# Patient Record
Sex: Male | Born: 1995 | Race: White | Hispanic: No | Marital: Single
Health system: Southern US, Community
[De-identification: ages and names within clinical notes are randomized; demographics above are authoritative.]

---

## 2015-05-23 ENCOUNTER — Emergency Department (HOSPITAL_COMMUNITY): Payer: BLUE CROSS/BLUE SHIELD

## 2015-05-23 ENCOUNTER — Emergency Department (HOSPITAL_COMMUNITY)
Admission: EM | Admit: 2015-05-23 | Discharge: 2015-05-23 | Disposition: A | Discharge status: Home / Self Care | Payer: BLUE CROSS/BLUE SHIELD | Attending: Emergency Medicine | Admitting: Emergency Medicine

## 2015-05-23 ENCOUNTER — Encounter (HOSPITAL_COMMUNITY): Payer: Self-pay | Admitting: *Deleted

## 2015-05-23 DIAGNOSIS — Y9389 Activity, other specified: Secondary | ICD-10-CM | POA: Diagnosis not present

## 2015-05-23 DIAGNOSIS — S0291XA Unspecified fracture of skull, initial encounter for closed fracture: Secondary | ICD-10-CM | POA: Diagnosis not present

## 2015-05-23 DIAGNOSIS — Z23 Encounter for immunization: Secondary | ICD-10-CM | POA: Insufficient documentation

## 2015-05-23 DIAGNOSIS — S80212A Abrasion, left knee, initial encounter: Secondary | ICD-10-CM | POA: Diagnosis not present

## 2015-05-23 DIAGNOSIS — S0101XA Laceration without foreign body of scalp, initial encounter: Secondary | ICD-10-CM | POA: Insufficient documentation

## 2015-05-23 DIAGNOSIS — S0181XA Laceration without foreign body of other part of head, initial encounter: Secondary | ICD-10-CM | POA: Diagnosis not present

## 2015-05-23 DIAGNOSIS — F10120 Alcohol abuse with intoxication, uncomplicated: Secondary | ICD-10-CM | POA: Diagnosis not present

## 2015-05-23 DIAGNOSIS — S80211A Abrasion, right knee, initial encounter: Secondary | ICD-10-CM | POA: Diagnosis not present

## 2015-05-23 DIAGNOSIS — S0990XA Unspecified injury of head, initial encounter: Secondary | ICD-10-CM | POA: Diagnosis present

## 2015-05-23 DIAGNOSIS — Y9289 Other specified places as the place of occurrence of the external cause: Secondary | ICD-10-CM | POA: Diagnosis not present

## 2015-05-23 DIAGNOSIS — R4182 Altered mental status, unspecified: Secondary | ICD-10-CM

## 2015-05-23 DIAGNOSIS — R451 Restlessness and agitation: Secondary | ICD-10-CM | POA: Diagnosis not present

## 2015-05-23 DIAGNOSIS — Y999 Unspecified external cause status: Secondary | ICD-10-CM | POA: Insufficient documentation

## 2015-05-23 DIAGNOSIS — F10929 Alcohol use, unspecified with intoxication, unspecified: Secondary | ICD-10-CM

## 2015-05-23 LAB — URINALYSIS, ROUTINE W REFLEX MICROSCOPIC
BILIRUBIN URINE: NEGATIVE
GLUCOSE, UA: NEGATIVE mg/dL
HGB URINE DIPSTICK: NEGATIVE
KETONES UR: NEGATIVE mg/dL
LEUKOCYTES UA: NEGATIVE
Nitrite: NEGATIVE
PROTEIN: NEGATIVE mg/dL
Specific Gravity, Urine: 1.008 (ref 1.005–1.030)
Urobilinogen, UA: 0.2 mg/dL (ref 0.0–1.0)
pH: 6 (ref 5.0–8.0)

## 2015-05-23 LAB — I-STAT CHEM 8, ED
BUN: 9 mg/dL (ref 6–20)
Calcium, Ion: 0.98 mmol/L — ABNORMAL LOW (ref 1.12–1.23)
Chloride: 102 mmol/L (ref 101–111)
Creatinine, Ser: 1.3 mg/dL — ABNORMAL HIGH (ref 0.61–1.24)
Glucose, Bld: 118 mg/dL — ABNORMAL HIGH (ref 65–99)
HEMATOCRIT: 46 % (ref 39.0–52.0)
HEMOGLOBIN: 15.6 g/dL (ref 13.0–17.0)
Potassium: 3.2 mmol/L — ABNORMAL LOW (ref 3.5–5.1)
SODIUM: 141 mmol/L (ref 135–145)
TCO2: 22 mmol/L (ref 0–100)

## 2015-05-23 LAB — CBC WITH DIFFERENTIAL/PLATELET
Basophils Absolute: 0 10*3/uL (ref 0.0–0.1)
Basophils Relative: 0 %
EOS ABS: 0.1 10*3/uL (ref 0.0–0.7)
Eosinophils Relative: 2 %
HEMATOCRIT: 43.3 % (ref 39.0–52.0)
HEMOGLOBIN: 15.3 g/dL (ref 13.0–17.0)
LYMPHS ABS: 2.7 10*3/uL (ref 0.7–4.0)
LYMPHS PCT: 33 %
MCH: 31.2 pg (ref 26.0–34.0)
MCHC: 35.3 g/dL (ref 30.0–36.0)
MCV: 88.4 fL (ref 78.0–100.0)
Monocytes Absolute: 0.3 10*3/uL (ref 0.1–1.0)
Monocytes Relative: 4 %
NEUTROS ABS: 5 10*3/uL (ref 1.7–7.7)
NEUTROS PCT: 61 %
PLATELETS: 163 10*3/uL (ref 150–400)
RBC: 4.9 MIL/uL (ref 4.22–5.81)
RDW: 12.8 % (ref 11.5–15.5)
WBC: 8.1 10*3/uL (ref 4.0–10.5)

## 2015-05-23 LAB — I-STAT CG4 LACTIC ACID, ED
LACTIC ACID, VENOUS: 2.95 mmol/L — AB (ref 0.5–2.0)
LACTIC ACID, VENOUS: 1.46 mmol/L (ref 0.5–2.0)

## 2015-05-23 LAB — ACETAMINOPHEN LEVEL

## 2015-05-23 LAB — RAPID URINE DRUG SCREEN, HOSP PERFORMED
Amphetamines: NOT DETECTED
BARBITURATES: NOT DETECTED
Benzodiazepines: NOT DETECTED
Cocaine: NOT DETECTED
Opiates: NOT DETECTED
Tetrahydrocannabinol: POSITIVE — AB

## 2015-05-23 LAB — SALICYLATE LEVEL: Salicylate Lvl: <4 mg/dL (ref 2.8–30.0)

## 2015-05-23 LAB — ETHANOL: ALCOHOL ETHYL (B): 325 mg/dL — AB (ref <5)

## 2015-05-23 MED ORDER — HALOPERIDOL LACTATE 5 MG/ML IJ SOLN
5.0000 mg | Freq: Once | INTRAMUSCULAR | Status: AC
  Administered 2015-05-23: 5 mg via INTRAVENOUS

## 2015-05-23 MED ORDER — IOHEXOL 300 MG/ML  SOLN
100.0000 mL | Freq: Once | INTRAMUSCULAR | Status: AC | PRN
  Administered 2015-05-23: 100 mL via INTRAVENOUS

## 2015-05-23 MED ORDER — ZIPRASIDONE MESYLATE 20 MG IM SOLR: 20.0000 mg | Freq: Once | INTRAMUSCULAR | Status: DC

## 2015-05-23 MED ORDER — CEFAZOLIN SODIUM 1-5 GM-% IV SOLN
1.0000 g | Freq: Once | INTRAVENOUS | Status: AC
  Administered 2015-05-23: 1 g via INTRAVENOUS
  Filled 2015-05-23: qty 50

## 2015-05-23 MED ORDER — HALOPERIDOL LACTATE 5 MG/ML IJ SOLN
2.0000 mg | Freq: Once | INTRAMUSCULAR | Status: AC
  Administered 2015-05-23: 2 mg via INTRAMUSCULAR
  Filled 2015-05-23: qty 1

## 2015-05-23 MED ORDER — POTASSIUM CHLORIDE 10 MEQ/100ML IV SOLN
10.0000 meq | INTRAVENOUS | Status: AC
  Administered 2015-05-23 (×2): 10 meq via INTRAVENOUS
  Filled 2015-05-23 (×3): qty 100

## 2015-05-23 MED ORDER — ROCURONIUM BROMIDE 50 MG/5ML IV SOLN
INTRAVENOUS | Status: AC
  Filled 2015-05-23: qty 2

## 2015-05-23 MED ORDER — SODIUM CHLORIDE 0.9 % IV BOLUS (SEPSIS)
1000.0000 mL | Freq: Once | INTRAVENOUS | Status: AC
  Administered 2015-05-23: 1000 mL via INTRAVENOUS

## 2015-05-23 MED ORDER — HALOPERIDOL LACTATE 5 MG/ML IJ SOLN
3.0000 mg | Freq: Once | INTRAMUSCULAR | Status: AC
  Administered 2015-05-23: 3 mg via INTRAMUSCULAR

## 2015-05-23 MED ORDER — ETOMIDATE 2 MG/ML IV SOLN
INTRAVENOUS | Status: AC
  Filled 2015-05-23: qty 20

## 2015-05-23 MED ORDER — HALOPERIDOL LACTATE 5 MG/ML IJ SOLN
5.0000 mg | Freq: Once | INTRAMUSCULAR | Status: DC
  Filled 2015-05-23: qty 1

## 2015-05-23 MED ORDER — MELOXICAM 7.5 MG PO TABS: 7.5000 mg | ORAL_TABLET | Freq: Every day | ORAL | Status: AC

## 2015-05-23 MED ORDER — ZIPRASIDONE MESYLATE 20 MG IM SOLR
20.0000 mg | Freq: Once | INTRAMUSCULAR | Status: AC
  Administered 2015-05-23: 20 mg via INTRAMUSCULAR

## 2015-05-23 MED ORDER — SODIUM CHLORIDE 0.9 % IV BOLUS (SEPSIS)
2000.0000 mL | Freq: Once | INTRAVENOUS | Status: AC
  Administered 2015-05-23: 1000 mL via INTRAVENOUS

## 2015-05-23 MED ORDER — LIDOCAINE HCL (CARDIAC) 20 MG/ML IV SOLN
INTRAVENOUS | Status: AC
  Filled 2015-05-23: qty 5

## 2015-05-23 MED ORDER — TETANUS-DIPHTH-ACELL PERTUSSIS 5-2.5-18.5 LF-MCG/0.5 IM SUSP
0.5000 mL | Freq: Once | INTRAMUSCULAR | Status: AC
  Administered 2015-05-23: 0.5 mL via INTRAMUSCULAR
  Filled 2015-05-23: qty 0.5

## 2015-05-23 MED ORDER — AMOXICILLIN-POT CLAVULANATE 875-125 MG PO TABS: 1.0000 | ORAL_TABLET | Freq: Two times a day (BID) | ORAL | Status: AC

## 2015-05-23 MED ORDER — SUCCINYLCHOLINE CHLORIDE 20 MG/ML IJ SOLN
INTRAMUSCULAR | Status: AC
  Filled 2015-05-23: qty 1
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
## 2015-05-23 NOTE — ED Notes (Addendum)
This RN discontinued BLE restraints.  Pt resting and calm at this time.  Pulses intact, skin intact, skin warm and appropriate for ethnicity.  Pt remains in forensic restraints on BUE, [employer] at bedside.

## 2015-05-23 NOTE — ED Notes (Signed)
Pt taken to CT with this RN, on monitor, O2 and with [employer].

## 2015-05-23 NOTE — ED Notes (Signed)
Patient transported to CT 

## 2015-05-23 NOTE — ED Notes (Signed)
Pt refused to allow this RN and Magda PaganiniAudrey, Charity fundraiserN, to place urinal so pt could void.  Pt declined incontinence pad also.  Pt stated, "I am not comfortable with you placing my dick in that bottle."  Pt requested urinal to be left on bed.  [employer] made aware.
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
## 2015-05-23 NOTE — ED Provider Notes (Signed)
This patient's care was assumed from Dr. Nicanor AlconPalumbo at shift change. Please see her note for further information.  At shift change the plan is for the patient to sober up and be reevaluated prior to discharge to police custody. There was some concern that the patient was responding to internal stimuli on initial evaluation. The patient appears to have a psychiatric-related issue I will request psychiatric consultation at reevaluation. Otherwise, plan is for discharge to police custody.  At my evaluation the patient is alert and oriented 4. His speech is clear and coherent. He denies suicidal or homicidal ideations. He denies visual or auditory hallucinations. He does not appear to be responding to internal stimuli. He is requesting to be discharged. He tells me he is upset because he has missed his way back home today. I see no need for psychiatric consultation at this time and will discharge to police custody.    Cody FarrierWilliam Micaiah Litle, PA-C 05/23/15 1039  Cy BlamerApril Palumbo, MD 05/24/15 [phone]

## 2015-05-23 NOTE — ED Notes (Signed)
Patient transported to CT SCAN . 
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
